# Patient Record
Sex: Female | Born: 1990 | Race: White | Hispanic: No | Marital: Single | Smoking: Former smoker
Health system: Southern US, Community
[De-identification: ages and names within clinical notes are randomized; demographics above are authoritative.]

## PROBLEM LIST (undated history)

## (undated) DIAGNOSIS — Z789 Other specified health status: Secondary | ICD-10-CM

## (undated) DIAGNOSIS — F4310 Post-traumatic stress disorder, unspecified: Secondary | ICD-10-CM

## (undated) DIAGNOSIS — F909 Attention-deficit hyperactivity disorder, unspecified type: Secondary | ICD-10-CM

## (undated) DIAGNOSIS — I10 Essential (primary) hypertension: Secondary | ICD-10-CM

## (undated) DIAGNOSIS — F988 Other specified behavioral and emotional disorders with onset usually occurring in childhood and adolescence: Secondary | ICD-10-CM

## (undated) DIAGNOSIS — F32A Depression, unspecified: Secondary | ICD-10-CM

## (undated) DIAGNOSIS — F419 Anxiety disorder, unspecified: Secondary | ICD-10-CM

## (undated) HISTORY — DX: Other specified health status: Z78.9

## (undated) HISTORY — DX: Anxiety disorder, unspecified: F41.9

## (undated) HISTORY — DX: Other specified behavioral and emotional disorders with onset usually occurring in childhood and adolescence: F98.8

## (undated) HISTORY — DX: Essential (primary) hypertension: I10

## (undated) HISTORY — DX: Post-traumatic stress disorder, unspecified: F43.10

## (undated) HISTORY — DX: Depression, unspecified: F32.A

## (undated) HISTORY — PX: OTHER SURGICAL HISTORY: SHX169

## (undated) HISTORY — DX: Attention-deficit hyperactivity disorder, unspecified type: F90.9

---
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
## 2009-11-15 ENCOUNTER — Ambulatory Visit: Payer: Self-pay | Admitting: Family Medicine

## 2009-11-16 ENCOUNTER — Encounter (INDEPENDENT_AMBULATORY_CARE_PROVIDER_SITE_OTHER): Payer: Self-pay | Admitting: Family Medicine

## 2010-05-01 NOTE — Assessment & Plan Note (Signed)
Summary: UTI/EVM   Vital Signs:  Patient Profile:   20 Years Old Female CC:      UTI/ rwt Height:     61 inches Weight:      113 pounds BMI:     21.43 O2 Sat:      99 % O2 treatment:    Room Air Temp:     97.7 degrees F oral Pulse rate:   92 / minute Pulse rhythm:   regular Resp:     18 per minute BP sitting:   135 / 80  (right arm)  Pt. in pain?   yes    Location:   pelvis    Intensity:   6    Type:       burning  Vitals Entered By: Levonne Spiller EMT-P (November 15, 2009 1:21 PM)              Is Patient Diabetic? No Comments Pt. is a smoker. 1 pack per day.      Current Allergies: No known allergies History of Present Illness History from: patient Reason for visit: see chief complaint Chief Complaint: UTI/ rwt History of Present Illness: For 10 days to 2 weeks has been having symptoms of increased urinary frequency, then small amounts when she does go and dysuria, worse at night. She has been drinking cranberry juice, increased fluids and otc cystex (sp). She reports some fever/chills and nausea when it first started, but states that she no longer has these symptoms, just continuation of urinary symptoms.   REVIEW OF SYSTEMS Constitutional Symptoms       Complains of fever and chills.      Comments: at the beginning      Gastrointestinal       Complains of nausea/vomiting. Genitourniary       Complains of painful urination and loss of urinary control.      Denies blood or discharge from vagina and kidney stones. Neurological       Denies headaches.     Past History:  Past Medical History: Unremarkable  Past Surgical History: Denies surgical history  Family History: Father: A: 57 COPD/emphysema Mother: A: 56 heart disease Siblings: Brother: A: 21               Sister: A: 22  Social History: Current Smoker: 1 pack per day x 3 years Alcohol use-yes: 3-4 drinks/week Smoking Status:  current  Physical Exam General appearance: well developed, well  nourished, no acute distress Abdomen: focal tenderness(suprapubic), abdomen soft without obvious organomegaly Back: No CVA tenderness Skin: no obvious rashes or lesions MSE: oriented to time, place, and person  Assessment New Problems: DYSURIA (ICD-788.1)   Plan New Medications/Changes: CIPROFLOXACIN HCL 500 MG TABS (CIPROFLOXACIN HCL) 1 by mouth daily x 7 days for infection  #7 x 0, 11/15/2009, Tacey Ruiz MD  New Orders: New Patient Level II [99202] T-Culture, Urine [[id number]]  The patient and/or caregiver has been counseled thoroughly with regard to medications prescribed including dosage, schedule, interactions, rationale for use, and possible side effects and they verbalize understanding.  Diagnoses and expected course of recovery discussed and will return if not improved as expected or if the condition worsens. Patient and/or caregiver verbalized understanding.  Prescriptions: CIPROFLOXACIN HCL 500 MG TABS (CIPROFLOXACIN HCL) 1 by mouth daily x 7 days for infection  #7 x 0   Entered and Authorized by:   Tacey Ruiz MD   Signed by:   Tacey Ruiz MD on 11/15/2009   Method  used:   Electronically to        [facility] [phone]* (retail)       [address] [place], [place]  [address]       Ph: [phone] or [phone]       Fax: [phone]   RxID:   [id number]   Patient Instructions: 1)  Take your antibiotic as prescribed until ALL of it is gone, but stop if you develop a rash or swelling and contact our office as soon as possible. 2)  RTC/call in 1-2 days if no improvement.  Orders Added: 1)  New Patient Level II [99202] 2)  T-Culture, Urine [[id number]]  The risks, benefits and possible side effects of the treatments and tests were explained clearly to the patient and the patient verbalized understanding.  The patient was informed that there is no on-call provider or services available at this clinic during off-hours (when the clinic  is closed).  If the patient developed a problem or concern that required immediate attention, the patient was advised to go the the nearest available urgent care or emergency department for medical care.  The patient verbalized understanding.
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
## 2010-12-26 LAB — POCT I-STAT, CHEM 8
Calcium, Ion: 1.22
Glucose, Bld: 92
HCT: 47 — ABNORMAL HIGH
Hemoglobin: 16 — ABNORMAL HIGH
Potassium: 4.1
TCO2: 27

## 2010-12-26 LAB — URINALYSIS, ROUTINE W REFLEX MICROSCOPIC
Glucose, UA: NEGATIVE
Ketones, ur: NEGATIVE
Leukocytes, UA: NEGATIVE
Nitrite: NEGATIVE
Protein, ur: NEGATIVE
pH: 6.5

## 2010-12-26 LAB — URINE MICROSCOPIC-ADD ON

## 2010-12-26 LAB — CBC
HCT: 45.7
Hemoglobin: 15.4 — ABNORMAL HIGH
MCHC: 33.6
Platelets: 307
RDW: 12.6

## 2010-12-26 LAB — DIFFERENTIAL
Basophils Relative: 1
Eosinophils Absolute: 0.1
Eosinophils Relative: 1
Monocytes Absolute: 0.6
Monocytes Relative: 7
Neutrophils Relative %: 70

## 2010-12-26 LAB — POCT PREGNANCY, URINE: Preg Test, Ur: NEGATIVE

## 2013-12-18 ENCOUNTER — Ambulatory Visit: Payer: Self-pay | Admitting: Neurology

## 2017-06-02 ENCOUNTER — Encounter: Payer: Self-pay | Admitting: *Deleted

## 2017-06-02 ENCOUNTER — Other Ambulatory Visit: Payer: Self-pay

## 2017-06-02 ENCOUNTER — Emergency Department
Admission: EM | Admit: 2017-06-02 | Discharge: 2017-06-02 | Disposition: A | Discharge status: Home / Self Care | Payer: 59 | Attending: Emergency Medicine | Admitting: Emergency Medicine

## 2017-06-02 DIAGNOSIS — Z23 Encounter for immunization: Secondary | ICD-10-CM | POA: Insufficient documentation

## 2017-06-02 DIAGNOSIS — Y92099 Unspecified place in other non-institutional residence as the place of occurrence of the external cause: Secondary | ICD-10-CM | POA: Insufficient documentation

## 2017-06-02 DIAGNOSIS — F17210 Nicotine dependence, cigarettes, uncomplicated: Secondary | ICD-10-CM | POA: Diagnosis not present

## 2017-06-02 DIAGNOSIS — Y9389 Activity, other specified: Secondary | ICD-10-CM | POA: Insufficient documentation

## 2017-06-02 DIAGNOSIS — W540XXA Bitten by dog, initial encounter: Secondary | ICD-10-CM | POA: Diagnosis not present

## 2017-06-02 DIAGNOSIS — S61255A Open bite of left ring finger without damage to nail, initial encounter: Secondary | ICD-10-CM | POA: Diagnosis present

## 2017-06-02 DIAGNOSIS — Y999 Unspecified external cause status: Secondary | ICD-10-CM | POA: Insufficient documentation

## 2017-06-02 DIAGNOSIS — Z203 Contact with and (suspected) exposure to rabies: Secondary | ICD-10-CM

## 2017-06-02 MED ORDER — AMOXICILLIN-POT CLAVULANATE 875-125 MG PO TABS
1.0000 | ORAL_TABLET | Freq: Once | ORAL | Status: AC
  Administered 2017-06-02: 1 via ORAL
  Filled 2017-06-02: qty 1

## 2017-06-02 MED ORDER — BACITRACIN ZINC 500 UNIT/GM EX OINT
TOPICAL_OINTMENT | Freq: Once | CUTANEOUS | Status: AC
  Administered 2017-06-02: 1 via TOPICAL
  Filled 2017-06-02: qty 0.9

## 2017-06-02 MED ORDER — AMOXICILLIN-POT CLAVULANATE 875-125 MG PO TABS: 1.0000 | ORAL_TABLET | Freq: Two times a day (BID) | ORAL | 0 refills | Status: AC

## 2017-06-02 MED ORDER — LIDOCAINE HCL (PF) 1 % IJ SOLN
5.0000 mL | Freq: Once | INTRAMUSCULAR | Status: AC
  Administered 2017-06-02: 5 mL via INTRADERMAL
  Filled 2017-06-02: qty 5

## 2017-06-02 MED ORDER — TETANUS-DIPHTH-ACELL PERTUSSIS 5-2.5-18.5 LF-MCG/0.5 IM SUSP
0.5000 mL | Freq: Once | INTRAMUSCULAR | Status: AC
  Administered 2017-06-02: 0.5 mL via INTRAMUSCULAR
  Filled 2017-06-02: qty 0.5

## 2017-06-02 NOTE — ED Triage Notes (Signed)
Pt has dog bite to left third finger.  Bleeding controlled.  Denies other injury

## 2017-06-02 NOTE — ED Notes (Signed)
ED Provider at bedside for suturing.  

## 2017-06-02 NOTE — Discharge Instructions (Signed)
Follow-up with your regular doctor or an acute care if there are any signs of infection.  Take the Augmentin for the full 7 days.  It is vitally important you finish this medication as dog bites frequently get infected especially as we have closed the wound.  Keep the area clean and dry.  Wash daily with soap and water.  If you develop more pain, swelling, redness or pus please visit your nearest emergency department.  Sutures should be removed in 7 days

## 2017-06-02 NOTE — ED Provider Notes (Signed)
[facility] Emergency Department Provider Note  ____________________________________________   First MD Initiated Contact with Patient 06/02/17 2216     (approximate)  I have reviewed the triage vital signs and the nursing notes.   HISTORY  Chief Complaint Animal Bite    HPI Joanna Snyder is a 27 y.o. female presents emergency department complaining of a dog bite to the left middle finger.  She states she had her dog at her friend's house and was feeding both dogs.  When she went to get the balls of the friend's dog bit her finger.  Friend states the dog's shots are up-to-date.  She is unsure of her last tetanus.  She denies any other injuries  History reviewed. No pertinent past medical history.  There are no active problems to display for this patient.   History reviewed. No pertinent surgical history.  Prior to Admission medications   Medication Sig Start Date End Date Taking? Authorizing Provider  amoxicillin-clavulanate (AUGMENTIN) 875-125 MG tablet Take 1 tablet by mouth 2 (two) times daily. 06/02/17   Faythe Ghee, PA-C    Allergies Patient has no known allergies.  No family history on file.  Social History Social History   Tobacco Use  . Smoking status: Current Every Day Smoker  . Smokeless tobacco: Never Used  Substance Use Topics  . Alcohol use: Yes    Frequency: Never  . Drug use: Not on file    Review of Systems  Constitutional: No fever/chills Eyes: No visual changes. ENT: No sore throat. Respiratory: Denies cough Genitourinary: Negative for dysuria. Musculoskeletal: Negative for back pain. Skin: Negative for rash.  Positive for dog bite to the left third finger    ____________________________________________   PHYSICAL EXAM:  VITAL SIGNS: ED Triage Vitals  Enc Vitals Group     BP 06/02/17 2210 (!) 143/97     Pulse Rate 06/02/17 2210 (!) 108     Resp 06/02/17 2210 20     Temp 06/02/17 2210 97.8 F  (36.6 C)     Temp Source 06/02/17 2210 Oral     SpO2 06/02/17 2210 99 %     Weight 06/02/17 2211 112 lb (50.8 kg)     Height 06/02/17 2211 5\' 1"  (1.549 m)     Head Circumference --      Peak Flow --      Pain Score 06/02/17 2211 7     Pain Loc --      Pain Edu? --      Excl. in GC? --     Constitutional: Alert and oriented. Well appearing and in no acute distress. Eyes: Conjunctivae are normal.  Head: Atraumatic. Nose: No congestion/rhinnorhea. Mouth/Throat: Mucous membranes are moist.   Cardiovascular: Normal rate, regular rhythm. Respiratory: Normal respiratory effort.  No retractions GU: deferred Musculoskeletal: FROM all extremities, warm and well perfused, no bony tenderness is noted in the left hand and fingers Neurologic:  Normal speech and language.  Skin:  Skin is warm, dry and intact. No rash noted.  Positive for a laceration to the dorsal aspect of the distal third finger on the left hand.  The wound is open and gaping.  No foreign body is noted Psychiatric: Mood and affect are normal. Speech and behavior are normal.  ____________________________________________   LABS (all labs ordered are listed, but only abnormal results are displayed)  Labs Reviewed - No data to display ____________________________________________   ____________________________________________  RADIOLOGY    ____________________________________________   PROCEDURES  Procedure(s) performed:   Marland KitchenMarland KitchenLaceration Repair Date/Time: 06/02/2017 11:14 PM Performed by: Faythe Ghee, PA-C Authorized by: Faythe Ghee, PA-C   Consent:    Consent obtained:  Verbal   Consent given by:  Patient   Risks discussed:  Infection, pain, retained foreign body, poor cosmetic result and poor wound healing   Alternatives discussed:  No treatment Anesthesia (see MAR for exact dosages):    Anesthesia method:  Nerve block   Block needle gauge:  25 G   Block anesthetic:  Lidocaine 1% w/o epi   Block  technique:  Digital block   Block injection procedure:  Anatomic landmarks identified, introduced needle, incremental injection, anatomic landmarks palpated and negative aspiration for blood   Block outcome:  Anesthesia achieved Laceration details:    Location:  Finger   Finger location:  L ring finger Repair type:    Repair type:  Simple Pre-procedure details:    Preparation:  Patient was prepped and draped in usual sterile fashion Exploration:    Hemostasis achieved with:  Direct pressure   Wound extent: no foreign bodies/material noted, no nerve damage noted, no tendon damage noted, no underlying fracture noted and no vascular damage noted     Contaminated: no   Treatment:    Area cleansed with:  Betadine and saline   Amount of cleaning:  Extensive   Irrigation solution:  Sterile saline   Irrigation volume:    Irrigation method:  Pressure wash   Visualized foreign bodies/material removed: no   Skin repair:    Repair method:  Sutures   Suture size:  5-0   Suture technique:  Simple interrupted   Number of sutures:  2 Approximation:    Approximation:  Loose   Vermilion border: well-aligned   Post-procedure details:    Dressing:  Antibiotic ointment and non-adherent dressing   Patient tolerance of procedure:  Tolerated well, no immediate complications      ____________________________________________   INITIAL IMPRESSION / ASSESSMENT AND PLAN / ED COURSE  Pertinent labs & imaging results that were available during my care of the patient were reviewed by me and considered in my medical decision making (see chart for details).  Patient is a 27 year old female complaining of a dog bite to the left third finger.  It was a provoked bite due to her removing food bolus with a new dog surrounding the dog that lives in the house  On physical exam there is a 1 cm laceration to the distal third finger, no foreign body is noted  The patient was given a digital block to the  left third finger.  She is to soak the finger in Betadine and saline for 10-15 minutes.  Tdap was ordered   Due to the wound being quite open, 2 sutures were inserted.  Patient was given Augmentin 875 p.o. tonight.  She is given prescription for Augmentin.  She was cautioned that closing the wound will cause infection.  She states she understands and she will comply with taking the antibiotic as prescribed.  She is leaving for [place] tomorrow.  She is to take the stitches out in 7 days.  If she has more redness, pain, swelling or signs of infection she should go to an urgent care or emergency department in [place].  Patient states she understands.  She is discharged in stable condition  As part of my medical decision making, I reviewed the following data within the electronic MEDICAL RECORD NUMBER Nursing notes reviewed and incorporated, Notes  from prior ED visits and [place] Controlled Substance Database  ____________________________________________   FINAL CLINICAL IMPRESSION(S) / ED DIAGNOSES  Final diagnoses:  Dog bite, initial encounter      NEW MEDICATIONS STARTED DURING THIS VISIT:  New Prescriptions   AMOXICILLIN-CLAVULANATE (AUGMENTIN) 875-125 MG TABLET    Take 1 tablet by mouth 2 (two) times daily.     Note:  This document was prepared using Dragon voice recognition software and may include unintentional dictation errors.    Faythe GheeFisher, Sherronda Sweigert W, PA-C 06/02/17 2320    Nita SickleVeronese, Stanton, MD 06/02/17 [phone]

## 2019-06-30 ENCOUNTER — Ambulatory Visit: Payer: Self-pay | Attending: Internal Medicine

## 2019-06-30 DIAGNOSIS — Z23 Encounter for immunization: Secondary | ICD-10-CM

## 2019-06-30 NOTE — Progress Notes (Signed)
   Covid-19 Vaccination Clinic  Name:  LETISIA SCHWALB    MRN: [id number] DOB: 09-28-1990  06/30/2019  Ms. Smitherman was observed post Covid-19 immunization for 15 minutes without incident. She was provided with Vaccine Information Sheet and instruction to access the V-Safe system.   Ms. Futrell was instructed to call 911 with any severe reactions post vaccine: Marland Kitchen Difficulty breathing  . Swelling of face and throat  . A fast heartbeat  . A bad rash all over body  . Dizziness and weakness   Immunizations Administered    Name Date Dose VIS Date Route   Pfizer COVID-19 Vaccine 06/30/2019  5:12 PM 0.3 mL 03/10/2019 Intramuscular   Manufacturer: [employer], [employer]   Lot: [id number]   NDC: [address]-1

## 2019-07-21 ENCOUNTER — Ambulatory Visit: Payer: Self-pay | Attending: Internal Medicine

## 2019-07-21 DIAGNOSIS — Z23 Encounter for immunization: Secondary | ICD-10-CM

## 2019-07-21 NOTE — Progress Notes (Signed)
   Covid-19 Vaccination Clinic  Name:  Joanna Snyder    MRN: [id number] DOB: 1990-05-30  07/21/2019  [name] was observed post Covid-19 immunization for 15 minutes without incident. She was provided with Vaccine Information Sheet and instruction to access the V-Safe system.   [name] was instructed to call 911 with any severe reactions post vaccine: Marland Kitchen Difficulty breathing  . Swelling of face and throat  . A fast heartbeat  . A bad rash all over body  . Dizziness and weakness   Immunizations Administered    Name Date Dose VIS Date Route   Pfizer COVID-19 Vaccine 07/21/2019  5:15 PM 0.3 mL 05/24/2018 Intramuscular   Manufacturer: [employer], [employer]   Lot: [id number]   NDC: [address]-0

## 2019-09-13 ENCOUNTER — Other Ambulatory Visit: Payer: Self-pay | Admitting: Neurology

## 2019-09-13 DIAGNOSIS — G932 Benign intracranial hypertension: Secondary | ICD-10-CM

## 2019-09-14 ENCOUNTER — Other Ambulatory Visit: Payer: Self-pay

## 2019-09-14 ENCOUNTER — Ambulatory Visit
Admission: RE | Admit: 2019-09-14 | Discharge: 2019-09-14 | Disposition: A | Discharge status: Home / Self Care | Payer: Managed Care, Other (non HMO) | Source: Ambulatory Visit | Attending: Neurology | Admitting: Neurology

## 2019-09-14 DIAGNOSIS — G932 Benign intracranial hypertension: Secondary | ICD-10-CM | POA: Diagnosis not present

## 2019-09-14 MED ORDER — GADOBUTROL 1 MMOL/ML IV SOLN
6.0000 mL | Freq: Once | INTRAVENOUS | Status: AC | PRN
  Administered 2019-09-14: 6 mL via INTRAVENOUS

## 2019-10-31 ENCOUNTER — Other Ambulatory Visit: Payer: Self-pay | Admitting: Student

## 2019-10-31 ENCOUNTER — Other Ambulatory Visit: Payer: Self-pay | Admitting: Ophthalmology

## 2019-10-31 DIAGNOSIS — R140 Abdominal distension (gaseous): Secondary | ICD-10-CM

## 2019-10-31 DIAGNOSIS — K529 Noninfective gastroenteritis and colitis, unspecified: Secondary | ICD-10-CM

## 2019-10-31 DIAGNOSIS — R1084 Generalized abdominal pain: Secondary | ICD-10-CM

## 2019-11-08 ENCOUNTER — Ambulatory Visit: Admission: RE | Admit: 2019-11-08 | Payer: Managed Care, Other (non HMO) | Source: Ambulatory Visit

## 2020-12-21 IMAGING — MR MR MRV HEAD WO/W CM
48 series · 48 of 48 positions shown · IV contrast (6ml Gadavist)
Comparison: Brain MRI in 12/18/2013.

CLINICAL DATA: 29-year-old female with headaches, spots in her
vision, optic nerve swelling on eye exam 1 week ago.

EXAM:
MRI HEAD WITHOUT AND WITH CONTRAST
MR BANDIM BOY WITHOUT AND WITH CONTRAST
TECHNIQUE: Multiplanar, multiecho pulse sequences of the brain and surrounding
structures were obtained without and with intravenous contrast.
Angiographic images of the head were obtained using MRV technique
without and with contrast.
CONTRAST:  6mL GADAVIST GADOBUTROL 1 MMOL/ML IV SOLN

[Series 5: ax dwi_tracew · axial · 3.0mm · 0.60mm/px · 1 of 48 slices shown]
[im 1/48]
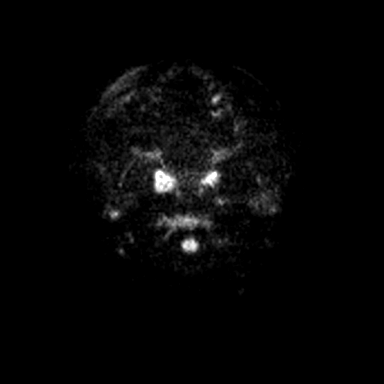

[Series 6: ax dwi_adc · axial · 3.0mm · 0.60mm/px · 1 of 48 slices shown]
[im 1/48]
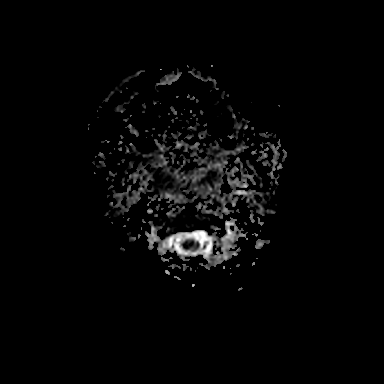

[Series 7: cor dwi_tracew · coronal · 5.0mm · 0.60mm/px · 1 of 38 slices shown]
[im 1/38]
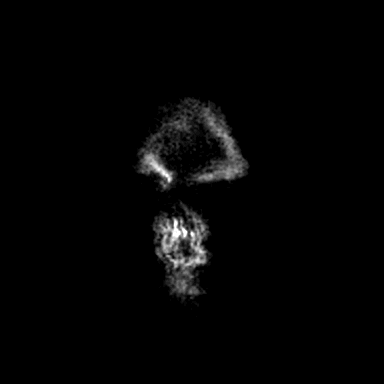

[Series 8: cor dwi_adc · coronal · 5.0mm · 0.60mm/px · 1 of 38 slices shown]
[im 1/38]
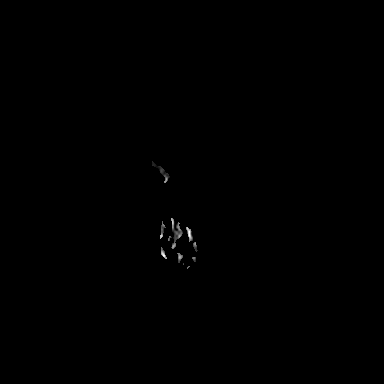

[Series 9: T1 · sagittal · 5.0mm · 0.62mm/px · 1 of 22 slices shown (1 of 2)]
[im 1/22]
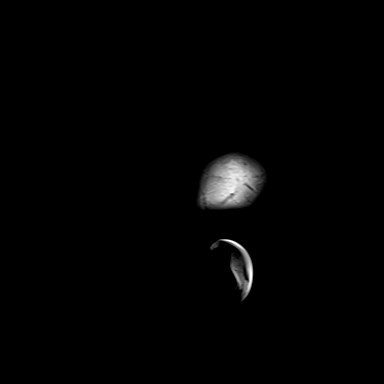

[Series 10: T2 · axial · 5.0mm · 0.53mm/px · 1 of 25 slices shown]
[im 1/25]
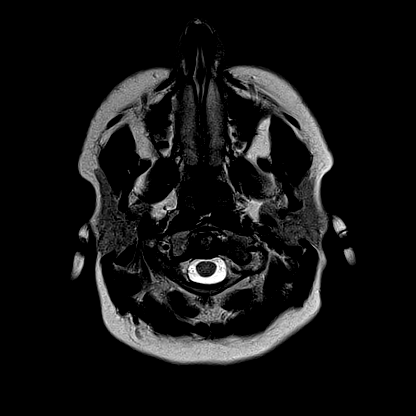

[Series 11: mag_images · axial · 3.0mm · 0.90mm/px · 1 of 60 slices shown]
[im 1/60]
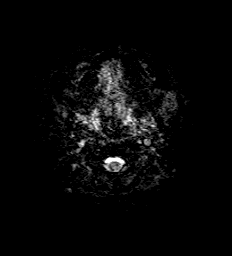

[Series 12: pha_images · axial · 3.0mm · 0.90mm/px · 1 of 59 slices shown]
[im 1/59]
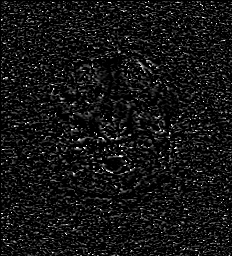

[Series 13: swi_images · axial · 3.0mm · 0.90mm/px · 1 of 60 slices shown]
[im 1/60]
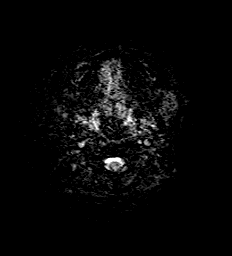

[Series 14: mip_images(sw) · axial · 24.0mm · 0.90mm/px · 1 of 53 slices shown]
[im 1/53]
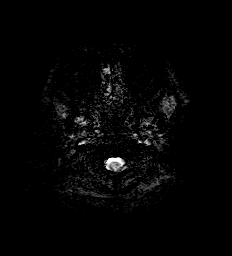

[Series 15: FLAIR · axial · 3.0mm · 0.53mm/px · 1 of 55 slices shown]
[im 1/55]
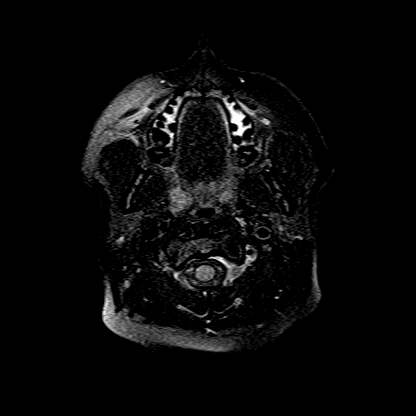

[Series 16: T1 · axial · 1.0mm · 0.98mm/px · 1 of 160 slices shown (2 of 2)]
[im 1/160]
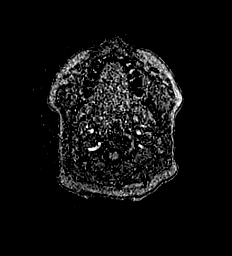

[Series 17: TOF · coronal · 2.5mm · 0.98mm/px · 1 of 128 slices shown]
[im 1/128]
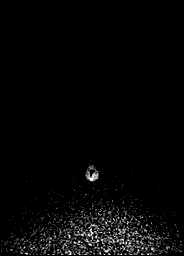

[Series 21: T2 post-contrast · coronal · 5.0mm · 0.57mm/px · 1 of 29 slices shown]
[im 1/29]
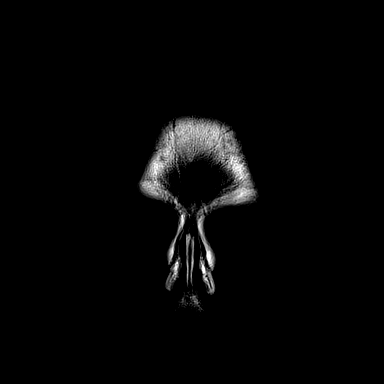

[Series 22: T1 post-contrast · axial · 1.0mm · 0.98mm/px · 1 of 160 slices shown]
[im 1/160]
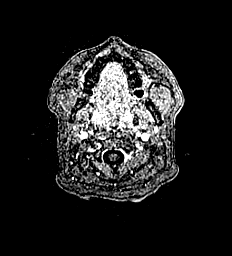

[Series 23: angio_twist_sag_iso · sagittal · 1.0mm · 1.00mm/px · 1 of 160 slices shown (1 of 33)]
[im 1/160]
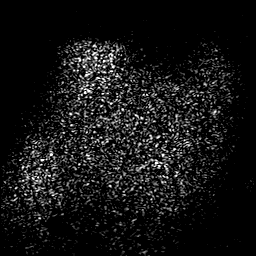

[Series 23: angio_twist_sag_iso · sagittal · 1.0mm · 1.00mm/px · 1 of 160 slices shown (2 of 33)]
[im 1/160]
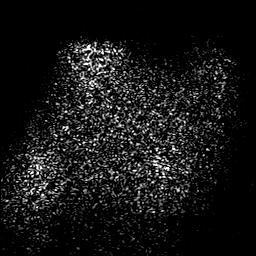

[Series 23: angio_twist_sag_iso · sagittal · 1.0mm · 1.00mm/px · 1 of 160 slices shown (3 of 33)]
[im 1/160]
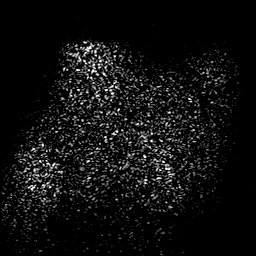

[Series 23: angio_twist_sag_iso · sagittal · 1.0mm · 1.00mm/px · 1 of 160 slices shown (4 of 33)]
[im 1/160]
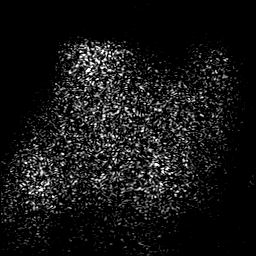

[Series 23: angio_twist_sag_iso · sagittal · 1.0mm · 1.00mm/px · 1 of 160 slices shown (5 of 33)]
[im 1/160]
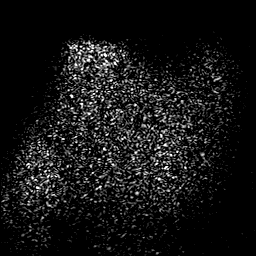

[Series 23: angio_twist_sag_iso · sagittal · 1.0mm · 1.00mm/px · 1 of 160 slices shown (6 of 33)]
[im 1/160]
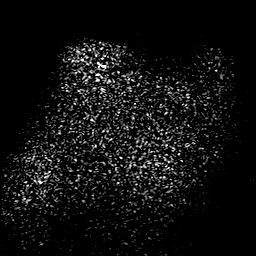

[Series 23: angio_twist_sag_iso · sagittal · 1.0mm · 1.00mm/px · 1 of 160 slices shown (7 of 33)]
[im 1/160]
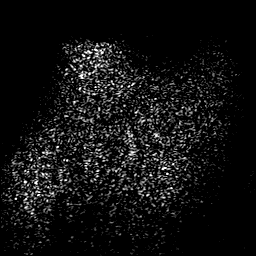

[Series 23: angio_twist_sag_iso · sagittal · 1.0mm · 1.00mm/px · 1 of 160 slices shown (8 of 33)]
[im 1/160]
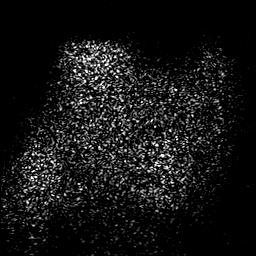

[Series 23: angio_twist_sag_iso · sagittal · 1.0mm · 1.00mm/px · 1 of 160 slices shown (9 of 33)]
[im 1/160]
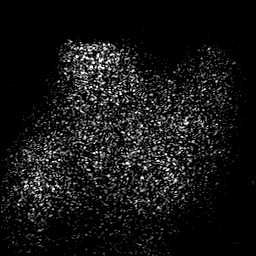

[Series 23: angio_twist_sag_iso · sagittal · 1.0mm · 1.00mm/px · 1 of 160 slices shown (10 of 33)]
[im 1/160]
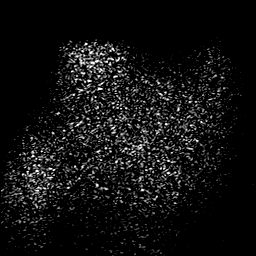

[Series 23: angio_twist_sag_iso · sagittal · 1.0mm · 1.00mm/px · 1 of 160 slices shown (11 of 33)]
[im 1/160]
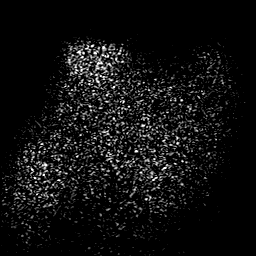

[Series 23: angio_twist_sag_iso · sagittal · 1.0mm · 1.00mm/px · 1 of 160 slices shown (12 of 33)]
[im 1/160]
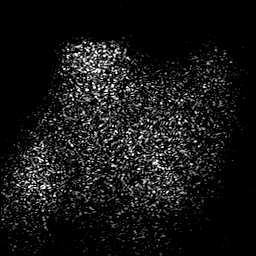

[Series 23: angio_twist_sag_iso · sagittal · 1.0mm · 1.00mm/px · 1 of 160 slices shown (13 of 33)]
[im 1/160]
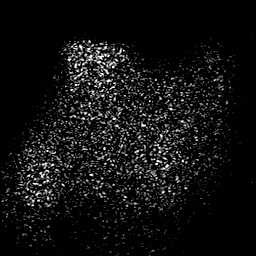

[Series 23: angio_twist_sag_iso · sagittal · 1.0mm · 1.00mm/px · 1 of 160 slices shown (14 of 33)]
[im 1/160]
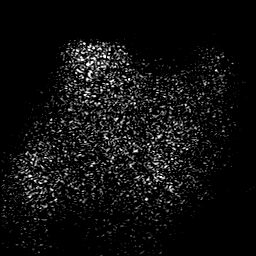

[Series 23: angio_twist_sag_iso · sagittal · 1.0mm · 1.00mm/px · 1 of 160 slices shown (15 of 33)]
[im 1/160]
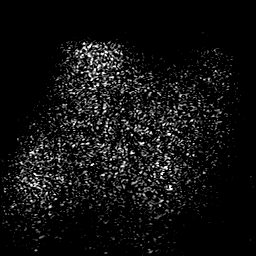

[Series 23: angio_twist_sag_iso · sagittal · 1.0mm · 1.00mm/px · 1 of 160 slices shown (16 of 33)]
[im 1/160]
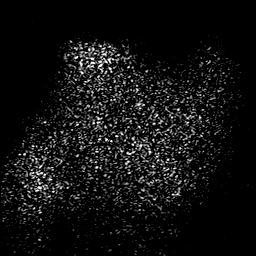

[Series 23: angio_twist_sag_iso · sagittal · 1.0mm · 1.00mm/px · 1 of 160 slices shown (17 of 33)]
[im 1/160]
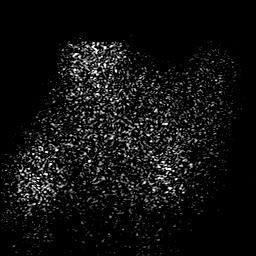

[Series 23: angio_twist_sag_iso · sagittal · 1.0mm · 1.00mm/px · 1 of 160 slices shown (18 of 33)]
[im 1/160]
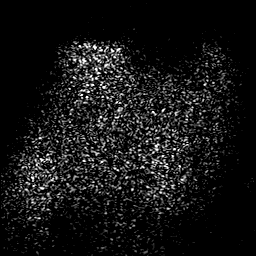

[Series 23: angio_twist_sag_iso · sagittal · 1.0mm · 1.00mm/px · 1 of 160 slices shown (19 of 33)]
[im 1/160]
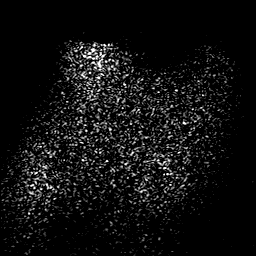

[Series 23: angio_twist_sag_iso · sagittal · 1.0mm · 1.00mm/px · 1 of 160 slices shown (20 of 33)]
[im 1/160]
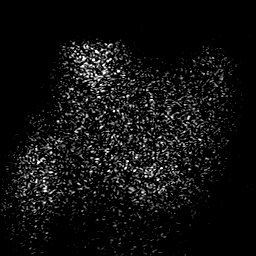

[Series 23: angio_twist_sag_iso · sagittal · 1.0mm · 1.00mm/px · 1 of 160 slices shown (21 of 33)]
[im 1/160]
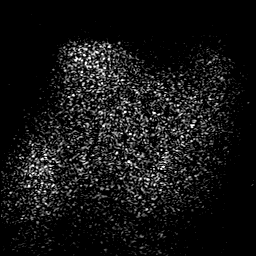

[Series 23: angio_twist_sag_iso · sagittal · 1.0mm · 1.00mm/px · 1 of 160 slices shown (22 of 33)]
[im 1/160]
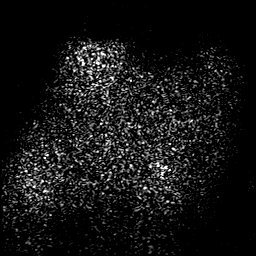

[Series 23: angio_twist_sag_iso · sagittal · 1.0mm · 1.00mm/px · 1 of 160 slices shown (23 of 33)]
[im 1/160]
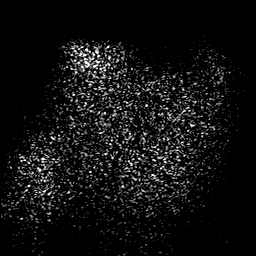

[Series 23: angio_twist_sag_iso · sagittal · 1.0mm · 1.00mm/px · 1 of 160 slices shown (24 of 33)]
[im 1/160]
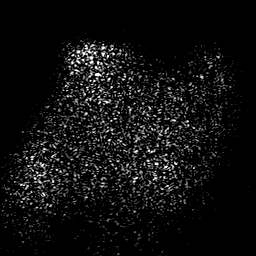

[Series 23: angio_twist_sag_iso · sagittal · 1.0mm · 1.00mm/px · 1 of 160 slices shown (25 of 33)]
[im 1/160]
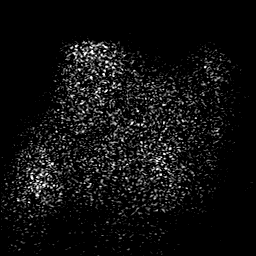

[Series 23: angio_twist_sag_iso · sagittal · 1.0mm · 1.00mm/px · 1 of 160 slices shown (26 of 33)]
[im 1/160]
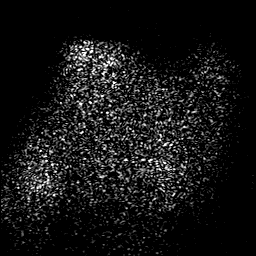

[Series 23: angio_twist_sag_iso · sagittal · 1.0mm · 1.00mm/px · 1 of 160 slices shown (27 of 33)]
[im 1/160]
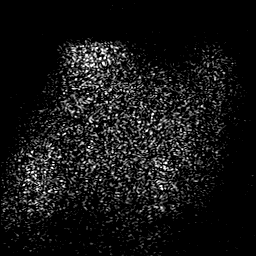

[Series 23: angio_twist_sag_iso · sagittal · 1.0mm · 1.00mm/px · 1 of 160 slices shown (28 of 33)]
[im 1/160]
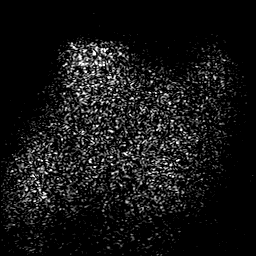

[Series 23: angio_twist_sag_iso · sagittal · 1.0mm · 1.00mm/px · 1 of 160 slices shown (29 of 33)]
[im 1/160]
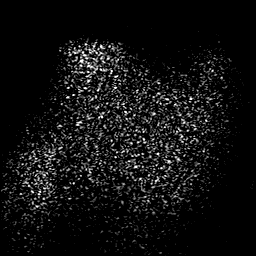

[Series 23: angio_twist_sag_iso · sagittal · 1.0mm · 1.00mm/px · 1 of 160 slices shown (30 of 33)]
[im 1/160]
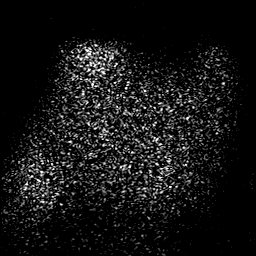

[Series 23: angio_twist_sag_iso · sagittal · 1.0mm · 1.00mm/px · 1 of 160 slices shown (31 of 33)]
[im 1/160]
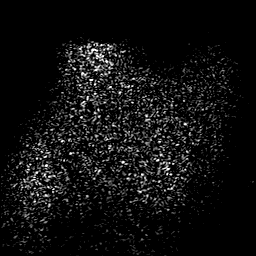

[Series 23: angio_twist_sag_iso · sagittal · 1.0mm · 1.00mm/px · 1 of 160 slices shown (32 of 33)]
[im 1/160]
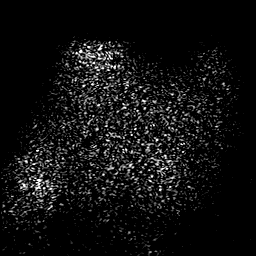

[Series 23: angio_twist_sag_iso · sagittal · 1.0mm · 1.00mm/px · 1 of 160 slices shown (33 of 33)]
[im 1/160]
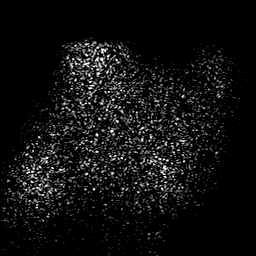

[48 of 48 positions shown; findings below may reference images not displayed]

FINDINGS: MRI HEAD FINDINGS

Brain: No restricted diffusion to suggest acute infarction. No
midline shift, mass effect, evidence of mass lesion,
ventriculomegaly, extra-axial collection or acute intracranial
hemorrhage. Cervicomedullary junction and pituitary are within
normal limits.

Gray and white matter signal remains normal throughout the brain. No
cortical encephalomalacia or chronic cerebral blood products
identified.

No abnormal enhancement identified.  No dural thickening.

Vascular: Major intracranial vascular flow voids are stable.

Skull and upper cervical spine: Negative visible cervical spine,
bone marrow signal.

Sinuses/Orbits: Orbits appear stable since 3394 and within normal
limits. Paranasal sinuses and mastoids are stable and well
pneumatized.

Other: Visible internal auditory structures appear normal. Scalp and
face soft tissues appear negative.

MRV HEAD FINDINGS

Time-of-flight MRV images demonstrate preserved flow signal in the
superior sagittal sinus, torcula, straight sinus, vein of Maulany,
internal cerebral veins and basal veins of Brennan. There is
preserved flow signal in the bilateral transverse and sigmoid
sinuses, the right side are dominant. Preserved flow signal in both
IJ bulbs, right side dominant.

Post-contrast MRV images demonstrate preserved enhancement of the
major venous vascular structures, including the cavernous sinus. The
junction of the transverse and sigmoid sinuses have a normal
morphology.
IMPRESSION: 1. Normal MRI appearance of the brain. No secondary findings of
increased intracranial pressure are identified.
2. Normal intracranial MRV.

## 2021-09-25 ENCOUNTER — Other Ambulatory Visit: Payer: Self-pay | Admitting: Internal Medicine

## 2021-09-25 DIAGNOSIS — R7989 Other specified abnormal findings of blood chemistry: Secondary | ICD-10-CM

## 2021-10-07 ENCOUNTER — Ambulatory Visit
Admission: RE | Admit: 2021-10-07 | Discharge: 2021-10-07 | Disposition: A | Discharge status: Home / Self Care | Payer: Managed Care, Other (non HMO) | Source: Ambulatory Visit | Attending: Internal Medicine | Admitting: Internal Medicine

## 2021-10-07 DIAGNOSIS — R7989 Other specified abnormal findings of blood chemistry: Secondary | ICD-10-CM

## 2022-11-05 ENCOUNTER — Other Ambulatory Visit: Payer: Self-pay | Admitting: Internal Medicine

## 2022-11-05 DIAGNOSIS — R7989 Other specified abnormal findings of blood chemistry: Secondary | ICD-10-CM

## 2022-11-05 DIAGNOSIS — R1030 Lower abdominal pain, unspecified: Secondary | ICD-10-CM

## 2022-11-09 ENCOUNTER — Ambulatory Visit
Admission: RE | Admit: 2022-11-09 | Discharge: 2022-11-09 | Disposition: A | Discharge status: Home / Self Care | Payer: Managed Care, Other (non HMO) | Source: Ambulatory Visit | Attending: Internal Medicine | Admitting: Internal Medicine

## 2022-11-09 DIAGNOSIS — R7989 Other specified abnormal findings of blood chemistry: Secondary | ICD-10-CM

## 2022-11-09 DIAGNOSIS — R1030 Lower abdominal pain, unspecified: Secondary | ICD-10-CM

## 2022-11-09 MED ORDER — IOPAMIDOL (ISOVUE-300) INJECTION 61%
100.0000 mL | Freq: Once | INTRAVENOUS | Status: AC | PRN
  Administered 2022-11-09: 100 mL via INTRAVENOUS

## 2022-11-10 ENCOUNTER — Encounter: Payer: Self-pay | Admitting: Oncology

## 2022-11-10 ENCOUNTER — Inpatient Hospital Stay: Payer: Managed Care, Other (non HMO) | Attending: Oncology | Admitting: Oncology

## 2022-11-10 ENCOUNTER — Inpatient Hospital Stay: Payer: Managed Care, Other (non HMO)

## 2022-11-10 VITALS — BP 123/99 | HR 88 | Temp 97.3°F | Resp 18 | Ht 61.0 in | Wt 122.0 lb

## 2022-11-10 DIAGNOSIS — D751 Secondary polycythemia: Secondary | ICD-10-CM

## 2022-11-10 LAB — CBC WITH DIFFERENTIAL/PLATELET
Abs Immature Granulocytes: 0.01 10*3/uL (ref 0.00–0.07)
Basophils Absolute: 0.1 10*3/uL (ref 0.0–0.1)
Basophils Relative: 2 %
Eosinophils Absolute: 0.1 10*3/uL (ref 0.0–0.5)
Eosinophils Relative: 2 %
HCT: 54.2 % — ABNORMAL HIGH (ref 36.0–46.0)
Hemoglobin: 18.8 g/dL — ABNORMAL HIGH (ref 12.0–15.0)
Immature Granulocytes: 0 %
Lymphocytes Relative: 36 %
Lymphs Abs: 1.4 10*3/uL (ref 0.7–4.0)
MCH: 32.9 pg (ref 26.0–34.0)
MCHC: 34.7 g/dL (ref 30.0–36.0)
MCV: 94.9 fL (ref 80.0–100.0)
Monocytes Absolute: 0.6 10*3/uL (ref 0.1–1.0)
Monocytes Relative: 15 %
Neutro Abs: 1.7 10*3/uL (ref 1.7–7.7)
Neutrophils Relative %: 45 %
Platelets: 221 10*3/uL (ref 150–400)
RBC: 5.71 MIL/uL — ABNORMAL HIGH (ref 3.87–5.11)
RDW: 13.2 % (ref 11.5–15.5)
WBC: 3.8 10*3/uL — ABNORMAL LOW (ref 4.0–10.5)
nRBC: 0 % (ref 0.0–0.2)

## 2022-11-10 NOTE — Progress Notes (Signed)
Hematology/Oncology Consult note [facility] [phone] [phone]  Patient Care Team: Lauro Regulus, MD as PCP - General (Internal Medicine)   Name of the patient: Joanna Snyder  [id number]  12-Jan-1991    Reason for referral-polycythemia   Referring physician-Dr. Dareen Piano  Date of visit: 11/10/22   History of presenting illness-patient is a 32 year old female with a past medical history Significant for anxiety and depression and attention deficit disorder.  She has been referred for polycythemia.  Patient used to smoke previously but over the last 2 years she vapes mostly.  Denies any exertional steroid use.  She has not had any history of thrombosis.  Reports ongoing fatigue.  Occasional dizziness.  ECOG PS- 0  Pain scale- 0   Review of systems- Review of Systems  Constitutional:  Positive for malaise/fatigue. Negative for chills, fever and weight loss.  HENT:  Negative for congestion, ear discharge and nosebleeds.   Eyes:  Negative for blurred vision.  Respiratory:  Negative for cough, hemoptysis, sputum production, shortness of breath and wheezing.   Cardiovascular:  Negative for chest pain, palpitations, orthopnea and claudication.  Gastrointestinal:  Negative for abdominal pain, blood in stool, constipation, diarrhea, heartburn, melena, nausea and vomiting.  Genitourinary:  Negative for dysuria, flank pain, frequency, hematuria and urgency.  Musculoskeletal:  Negative for back pain, joint pain and myalgias.  Skin:  Negative for rash.  Neurological:  Negative for dizziness, tingling, focal weakness, seizures, weakness and headaches.  Endo/Heme/Allergies:  Does not bruise/bleed easily.  Psychiatric/Behavioral:  Negative for depression and suicidal ideas. The patient does not have insomnia.     No Known Allergies  There are no problems to display for this patient.    Past Medical History:  Diagnosis Date   ADD  (attention deficit disorder)    ADHD (attention deficit hyperactivity disorder)    Anxiety    Depression    Hypertension    Known health problems: none    Post traumatic stress disorder (PTSD)      Past Surgical History:  Procedure Laterality Date   nexplanon     08/28/20   none      Social History   Socioeconomic History   Marital status: Single    Spouse name: Not on file   Number of children: Not on file   Years of education: 12   Highest education level: 12th grade  Occupational History   Not on file  Tobacco Use   Smoking status: Former    Current packs/day: 0.00    Types: Cigarettes    Quit date: 2021    Years since quitting: 3.6   Smokeless tobacco: Never  Vaping Use   Vaping status: Every Day  Substance and Sexual Activity   Alcohol use: Yes    Comment: 3-4 drinks a week   Drug use: Never   Sexual activity: Yes    Birth control/protection: I.U.D., Spermicide, Coitus interruptus, Other-see comments    Comment: nexplanon placed 08/28/20  Other Topics Concern   Not on file  Social History Narrative   Not on file   Social Determinants of Health   Financial Resource Strain: Not on file  Food Insecurity: Food Insecurity Present (11/10/2022)   Hunger Vital Sign    Worried About Running Out of Food in the Last Year: Often true    Ran Out of Food in the Last Year: Often true  Transportation Needs: No Transportation Needs (11/10/2022)   PRAPARE - Transportation    Lack  of Transportation (Medical): No    Lack of Transportation (Non-Medical): No  Physical Activity: Not on file  Stress: Not on file  Social Connections: Not on file  Intimate Partner Violence: Not At Risk (11/10/2022)   Humiliation, Afraid, Rape, and Kick questionnaire    Fear of Current or Ex-Partner: No    Emotionally Abused: No    Physically Abused: No    Sexually Abused: No     History reviewed. No pertinent family history.   Current Outpatient Medications:    amLODipine (NORVASC) 5 MG  tablet, Take 5 mg by mouth daily., Disp: , Rfl:    Multiple Vitamin (MULTIVITAMIN) capsule, Take 1 capsule by mouth daily., Disp: , Rfl:    tirzepatide (ZEPBOUND) 5 MG/0.5ML Pen, Inject 5 mg into the skin once a week., Disp: , Rfl:    traZODone (DESYREL) 100 MG tablet, Take 100 mg by mouth at bedtime., Disp: , Rfl:    venlafaxine (EFFEXOR) 75 MG tablet, Take 75 mg by mouth 2 (two) times daily., Disp: , Rfl:    amoxicillin-clavulanate (AUGMENTIN) 875-125 MG tablet, Take 1 tablet by mouth 2 (two) times daily. (Patient not taking: Reported on 11/10/2022), Disp: 14 tablet, Rfl: 0   Physical exam:  Vitals:   11/10/22 1053  BP: (!) 123/99  Pulse: 88  Resp: 18  Temp: (!) 97.3 F (36.3 C)  TempSrc: Tympanic  SpO2: 100%  Weight: 122 lb (55.3 kg)  Height: 5\' 1"  (1.549 m)   Physical Exam Cardiovascular:     Rate and Rhythm: Normal rate and regular rhythm.     Heart sounds: Normal heart sounds.  Pulmonary:     Effort: Pulmonary effort is normal.     Breath sounds: Normal breath sounds.  Abdominal:     General: Bowel sounds are normal.     Palpations: Abdomen is soft.     Comments: No palpable hepatosplenomegaly  Skin:    General: Skin is warm and dry.  Neurological:     Mental Status: She is alert and oriented to person, place, and time.           10/31/2007    7:13 PM  CMP  Glucose 92   BUN 12   Creatinine 0.9   Sodium 141   Potassium 4.1   Chloride 105       Latest Ref Rng & Units 11/10/2022   11:24 AM  CBC  WBC 4.0 - 10.5 K/uL 3.8   Hemoglobin 12.0 - 15.0 g/dL 28.4   Hematocrit 13.2 - 46.0 % 54.2   Platelets 150 - 400 K/uL 221     No images are attached to the encounter.  CT ABDOMEN PELVIS W CONTRAST  Result Date: 11/09/2022 CLINICAL DATA:  Low abdominal pain with reported abnormal CBC for 6 weeks. No previous relevant surgery. EXAM: CT ABDOMEN AND PELVIS WITH CONTRAST TECHNIQUE: Multidetector CT imaging of the abdomen and pelvis was performed using the standard  protocol following bolus administration of intravenous contrast. RADIATION DOSE REDUCTION: This exam was performed according to the departmental dose-optimization program which includes automated exposure control, adjustment of the mA and/or kV according to patient size and/or use of iterative reconstruction technique. CONTRAST:  ISOVUE-300 IOPAMIDOL (ISOVUE-300) INJECTION 61% COMPARISON:  Right upper quadrant abdominal ultrasound 10/07/2021. FINDINGS: Lower chest: Clear lung bases. No significant pleural or pericardial effusion. Hepatobiliary: The liver is normal in density without suspicious focal abnormality. Focal fat adjacent to the falciform ligament. The gallbladder appears normal without evidence of gallstones, gallbladder  wall thickening or biliary dilatation. Pancreas: Unremarkable. No pancreatic ductal dilatation or surrounding inflammatory changes. Spleen: Normal in size without focal abnormality. Adrenals/Urinary Tract: Both adrenal glands appear normal. No evidence of urinary tract calculus, suspicious renal lesion or hydronephrosis. The bladder appears unremarkable for its degree of distention. Stomach/Bowel: Enteric contrast has passed into the distal colon. The stomach appears unremarkable for its degree of distension. No evidence of bowel wall thickening, distention or surrounding inflammatory change. The appendix appears normal. The sigmoid colon and rectum are decompressed. Vascular/Lymphatic: There are no enlarged abdominal or pelvic lymph nodes. No significant vascular findings. Reproductive: Intrauterine device in place. The uterus and ovaries otherwise appear unremarkable. No adnexal mass. Other: No evidence of abdominal wall mass or hernia. No ascites or pneumoperitoneum. Musculoskeletal: No acute or significant osseous findings. Unless specific follow-up recommendations are mentioned in the findings or impression sections, no imaging follow-up of any mentioned incidental findings is  recommended. IMPRESSION: 1. No acute findings or explanation for the patient's symptoms. 2. Intrauterine device in place. Electronically Signed   By: Carey Bullocks M.D.   On: 11/09/2022 16:49    Assessment and plan- Patient is a 32 y.o. female referred for polycythemia  Discussed that polycythemia can be primary versus secondary.  I will be doing primary polycythemia vera workup including JAK2, CALR, MPL, exon 12 mutation.  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  If she has polycythemia vera she will still fall in the low risk group given that she is less than 19 years of age with no history of thrombosis and we can consider phlebotomy in that case to keep her hematocrit less than 45.  However if this turns out to be secondary polycythemia then usually phlebotomy is not indicated unless hematocrit is greater than 55 in which case she will need to strongly consider stopping vaping.  In person visit with me in 2 weeks time   Thank you for this kind referral and the opportunity to participate in the care of this patient   Visit Diagnosis 1. Polycythemia     Dr. Owens Shark, MD, MPH [facility] at [facility] [phone] 11/10/2022

## 2022-11-11 ENCOUNTER — Inpatient Hospital Stay: Payer: Managed Care, Other (non HMO) | Admitting: Licensed Clinical Social Worker

## 2022-11-11 NOTE — Progress Notes (Signed)
[facility] Clinical Social Work  Clinical Social Work was referred by  Dallie Piles in Registration  for assessment of psychosocial needs.  Clinical Social Worker contacted patient by phone to offer support and assess for needs.    Joanna Snyder received a bag of food yesterday after meeting with Joanna Snyder.  Spoke with patient about resources.  CSW to securely email a list of food pantries.  Once a treatment plan is determined, will discuss resources further.     Joanna Baseman, LCSW  Clinical Social Worker [facility]

## 2022-11-17 ENCOUNTER — Encounter: Payer: Self-pay | Admitting: Oncology

## 2022-11-18 LAB — MPL MUTATION ANALYSIS

## 2022-11-24 ENCOUNTER — Encounter: Payer: Self-pay | Admitting: Oncology

## 2022-11-24 ENCOUNTER — Inpatient Hospital Stay (HOSPITAL_BASED_OUTPATIENT_CLINIC_OR_DEPARTMENT_OTHER): Payer: Managed Care, Other (non HMO) | Admitting: Oncology

## 2022-11-24 ENCOUNTER — Inpatient Hospital Stay: Payer: Managed Care, Other (non HMO)

## 2022-11-24 VITALS — BP 127/91 | HR 99 | Temp 97.3°F | Resp 18 | Ht 61.0 in | Wt 123.9 lb

## 2022-11-24 DIAGNOSIS — D751 Secondary polycythemia: Secondary | ICD-10-CM | POA: Diagnosis not present

## 2022-11-24 NOTE — Progress Notes (Signed)
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  She has been referred for polycythemia.  Patient used to smoke previously but over the last 2 years she vapes mostly.  Denies any exertional steroid use.  She has not had any history of thrombosis.  Reports ongoing fatigue.  Occasional dizziness.   Results of blood work from 11/10/2022 showed white cell count of 3.8, H&H of 18.8/54 with a platelet count of 221.  EPO levels were normal at 16.7.  CALR, JAK2, MPL mutation testing negative.  Interval history-patient feels well overall other than mild fatigue she denies any complaints at this time  ECOG PS- 0 Pain scale- 0   Review of systems- Review of Systems  Constitutional:  Negative for chills, fever, malaise/fatigue and weight loss.  HENT:  Negative for congestion, ear discharge and nosebleeds.   Eyes:  Negative for blurred vision.  Respiratory:  Negative for cough, hemoptysis, sputum production, shortness of breath and wheezing.   Cardiovascular:  Negative for chest pain, palpitations, orthopnea and claudication.  Gastrointestinal:  Negative for abdominal pain, blood in stool, constipation, diarrhea, heartburn, melena, nausea and vomiting.  Genitourinary:  Negative for dysuria, flank pain, frequency, hematuria and urgency.  Musculoskeletal:  Negative for back pain, joint pain and myalgias.  Skin:  Negative for rash.   Neurological:  Negative for dizziness, tingling, focal weakness, seizures, weakness and headaches.  Endo/Heme/Allergies:  Does not bruise/bleed easily.  Psychiatric/Behavioral:  Negative for depression and suicidal ideas. The patient does not have insomnia.       Allergies  Allergen Reactions   No Known Allergies      Past Medical History:  Diagnosis Date   ADD (attention deficit disorder)    ADHD (attention deficit hyperactivity disorder)    Anxiety    Depression    Hypertension    Known health problems: none    Post traumatic stress disorder (PTSD)      Past Surgical History:  Procedure Laterality Date   nexplanon     08/28/20   none      Social History   Socioeconomic History   Marital status: Single    Spouse name: Not on file   Number of children: Not on file   Years of education: 12   Highest education level: 12th grade  Occupational History   Not on file  Tobacco Use   Smoking status: Former    Current packs/day: 0.00    Types: Cigarettes    Quit date: 2021    Years since quitting: 3.6   Smokeless tobacco: Never  Vaping Use   Vaping status: Every Day  Substance and Sexual Activity   Alcohol use: Yes    Comment: 3-4 drinks a week   Drug use: Never   Sexual activity: Yes    Birth control/protection: I.U.D., Spermicide, Coitus interruptus, Other-see comments    Comment: nexplanon placed 08/28/20  Other Topics Concern   Not on file  Social History Narrative   Not on file   Social Determinants  of Health   Financial Resource Strain: Not on file  Food Insecurity: Food Insecurity Present (11/10/2022)   Hunger Vital Sign    Worried About Running Out of Food in the Last Year: Often true    Ran Out of Food in the Last Year: Often true  Transportation Needs: No Transportation Needs (11/10/2022)   PRAPARE - Administrator, Civil Service (Medical): No    Lack of Transportation (Non-Medical): No  Physical Activity: Not on file  Stress: Not on  file  Social Connections: Not on file  Intimate Partner Violence: Not At Risk (11/10/2022)   Humiliation, Afraid, Rape, and Kick questionnaire    Fear of Current or Ex-Partner: No    Emotionally Abused: No    Physically Abused: No    Sexually Abused: No    Family History  Problem Relation Age of Onset   Heart disease Mother    COPD Father      Current Outpatient Medications:    amLODipine (NORVASC) 5 MG tablet, Take 5 mg by mouth daily., Disp: , Rfl:    Multiple Vitamin (MULTIVITAMIN) capsule, Take 1 capsule by mouth daily., Disp: , Rfl:    tirzepatide (ZEPBOUND) 5 MG/0.5ML Pen, Inject 5 mg into the skin once a week., Disp: , Rfl:    traZODone (DESYREL) 100 MG tablet, Take 100 mg by mouth at bedtime., Disp: , Rfl:    venlafaxine (EFFEXOR) 75 MG tablet, Take 75 mg by mouth 2 (two) times daily., Disp: , Rfl:    amoxicillin-clavulanate (AUGMENTIN) 875-125 MG tablet, Take 1 tablet by mouth 2 (two) times daily. (Patient not taking: Reported on 11/10/2022), Disp: 14 tablet, Rfl: 0  Physical exam:  Vitals:   11/24/22 1451  BP: (!) 127/91  Pulse: 99  Resp: 18  Temp: (!) 97.3 F (36.3 C)  TempSrc: Tympanic  Weight: 123 lb 14.4 oz (56.2 kg)  Height: 5\' 1"  (1.549 m)   Physical Exam Cardiovascular:     Rate and Rhythm: Normal rate and regular rhythm.     Heart sounds: Normal heart sounds.  Pulmonary:     Effort: Pulmonary effort is normal.  Skin:    General: Skin is warm and dry.  Neurological:     Mental Status: She is alert and oriented to person, place, and time.         10/31/2007    7:13 PM  CMP  Glucose 92   BUN 12   Creatinine 0.9   Sodium 141   Potassium 4.1   Chloride 105       Latest Ref Rng & Units 11/10/2022   11:24 AM  CBC  WBC 4.0 - 10.5 K/uL 3.8   Hemoglobin 12.0 - 15.0 g/dL 16.1   Hematocrit 09.6 - 46.0 % 54.2   Platelets 150 - 400 K/uL 221     No images are attached to the encounter.  CT ABDOMEN PELVIS W CONTRAST  Result Date:  11/09/2022 CLINICAL DATA:  Low abdominal pain with reported abnormal CBC for 6 weeks. No previous relevant surgery. EXAM: CT ABDOMEN AND PELVIS WITH CONTRAST TECHNIQUE: Multidetector CT imaging of the abdomen and pelvis was performed using the standard protocol following bolus administration of intravenous contrast. RADIATION DOSE REDUCTION: This exam was performed according to the departmental dose-optimization program which includes automated exposure control, adjustment of the mA and/or kV according to patient size and/or use of iterative reconstruction technique. CONTRAST:  ISOVUE-300 IOPAMIDOL (ISOVUE-300) INJECTION 61% COMPARISON:  Right upper quadrant abdominal ultrasound  10/07/2021. FINDINGS: Lower chest: Clear lung bases. No significant pleural or pericardial effusion. Hepatobiliary: The liver is normal in density without suspicious focal abnormality. Focal fat adjacent to the falciform ligament. The gallbladder appears normal without evidence of gallstones, gallbladder wall thickening or biliary dilatation. Pancreas: Unremarkable. No pancreatic ductal dilatation or surrounding inflammatory changes. Spleen: Normal in size without focal abnormality. Adrenals/Urinary Tract: Both adrenal glands appear normal. No evidence of urinary tract calculus, suspicious renal lesion or hydronephrosis. The bladder appears unremarkable for its degree of distention. Stomach/Bowel: Enteric contrast has passed into the distal colon. The stomach appears unremarkable for its degree of distension. No evidence of bowel wall thickening, distention or surrounding inflammatory change. The appendix appears normal. The sigmoid colon and rectum are decompressed. Vascular/Lymphatic: There are no enlarged abdominal or pelvic lymph nodes. No significant vascular findings. Reproductive: Intrauterine device in place. The uterus and ovaries otherwise appear unremarkable. No adnexal mass. Other: No evidence of abdominal wall mass or  hernia. No ascites or pneumoperitoneum. Musculoskeletal: No acute or significant osseous findings. Unless specific follow-up recommendations are mentioned in the findings or impression sections, no imaging follow-up of any mentioned incidental findings is recommended. IMPRESSION: 1. No acute findings or explanation for the patient's symptoms. 2. Intrauterine device in place. Electronically Signed   By: Carey Bullocks M.D.   On: 11/09/2022 16:49     Assessment and plan- Patient is a 32 y.o. female referred for polycythemia likely secondary due to vaping  Patient had aNormal H&H up until November 2022 when her hemoglobin was consistently between 13-14.  Since then there has been waxing and waning polycythemia noted with the hemoglobin that has fluctuated between 15-18 with a hematocrit between 50-54.Marland Kitchen  Reviewed results of myeloproliferative workup including JAK2, CALR, MPL mutation testing negative.  EPO levels are normal.  Patient therefore does not have polycythemia vera which carries an increased risks of strokes and heart attacks.  Patient's polycythemia is likely secondary due to his weight things and I have asked her strongly to cut down and eventually quit vaping.  I am referring her to smoking cessation clinic as well.  No indication for phlebotomy at this time and with less hematocrit consistently greater than 55.  Patient is taking a low-dose aspirin which is again typically not recommended for secondary polycythemia.  Key for her would be to stop vaping and see if her hematocrit improves.  If hematocrit fails to improve despite stopping vaping we can consider a bone marrow biopsy at that.  CBC in 3 months in 6 months and I will see her back in 6 months   Visit Diagnosis 1. Polycythemia, secondary      Dr. Owens Shark, MD, MPH [facility] at [facility] [phone] 11/24/2022 3:12 PM

## 2023-02-24 ENCOUNTER — Inpatient Hospital Stay: Payer: Managed Care, Other (non HMO) | Attending: Oncology

## 2023-02-24 ENCOUNTER — Other Ambulatory Visit: Payer: Managed Care, Other (non HMO)

## 2023-02-24 DIAGNOSIS — D751 Secondary polycythemia: Secondary | ICD-10-CM | POA: Insufficient documentation

## 2023-02-24 LAB — CBC
HCT: 40.3 % (ref 36.0–46.0)
Hemoglobin: 13.9 g/dL (ref 12.0–15.0)
MCH: 33.7 pg (ref 26.0–34.0)
MCHC: 34.5 g/dL (ref 30.0–36.0)
MCV: 97.6 fL (ref 80.0–100.0)
Platelets: 199 10*3/uL (ref 150–400)
RBC: 4.13 MIL/uL (ref 3.87–5.11)
RDW: 11.7 % (ref 11.5–15.5)
WBC: 4.7 10*3/uL (ref 4.0–10.5)
nRBC: 0 % (ref 0.0–0.2)

## 2023-05-13 ENCOUNTER — Other Ambulatory Visit: Payer: Self-pay | Admitting: Orthopedic Surgery

## 2023-05-13 DIAGNOSIS — M2242 Chondromalacia patellae, left knee: Secondary | ICD-10-CM

## 2023-05-28 ENCOUNTER — Inpatient Hospital Stay: Payer: Managed Care, Other (non HMO)

## 2023-05-28 ENCOUNTER — Inpatient Hospital Stay: Payer: Managed Care, Other (non HMO) | Admitting: Oncology

## 2023-05-29 ENCOUNTER — Ambulatory Visit
Admission: RE | Admit: 2023-05-29 | Discharge: 2023-05-29 | Disposition: A | Discharge status: Home / Self Care | Payer: Managed Care, Other (non HMO) | Source: Ambulatory Visit | Attending: Orthopedic Surgery | Admitting: Orthopedic Surgery

## 2023-05-29 DIAGNOSIS — M2242 Chondromalacia patellae, left knee: Secondary | ICD-10-CM
